# Patient Record
Sex: Male | Born: 2000 | Race: Black or African American | Hispanic: No | Marital: Single | Smoking: Never smoker
Health system: Southern US, Community
[De-identification: ages and names within clinical notes are randomized; demographics above are authoritative.]

---
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
## 2021-07-30 ENCOUNTER — Emergency Department
Admission: EM | Admit: 2021-07-30 | Discharge: 2021-07-31 | Disposition: A | Discharge status: Home / Self Care | Payer: Medicaid Other | Attending: Emergency Medicine | Admitting: Emergency Medicine

## 2021-07-30 ENCOUNTER — Other Ambulatory Visit: Payer: Self-pay

## 2021-07-30 DIAGNOSIS — R2242 Localized swelling, mass and lump, left lower limb: Secondary | ICD-10-CM | POA: Diagnosis present

## 2021-07-30 DIAGNOSIS — L0231 Cutaneous abscess of buttock: Secondary | ICD-10-CM | POA: Diagnosis not present

## 2021-07-30 DIAGNOSIS — Z5321 Procedure and treatment not carried out due to patient leaving prior to being seen by health care provider: Secondary | ICD-10-CM | POA: Diagnosis not present

## 2021-07-30 NOTE — ED Triage Notes (Addendum)
Pt in with co abscess to left buttocks x 5 days, has not drained. Pt with large abscess noted to right buttocks, not extending to rectum.

## 2023-02-25 ENCOUNTER — Encounter (HOSPITAL_COMMUNITY): Payer: Self-pay | Admitting: Emergency Medicine

## 2023-02-25 ENCOUNTER — Ambulatory Visit (HOSPITAL_COMMUNITY)
Admission: EM | Admit: 2023-02-25 | Discharge: 2023-02-25 | Disposition: A | Discharge status: Home / Self Care | Payer: 59 | Attending: Emergency Medicine | Admitting: Emergency Medicine

## 2023-02-25 ENCOUNTER — Other Ambulatory Visit: Payer: Self-pay

## 2023-02-25 DIAGNOSIS — J029 Acute pharyngitis, unspecified: Secondary | ICD-10-CM | POA: Diagnosis present

## 2023-02-25 DIAGNOSIS — H109 Unspecified conjunctivitis: Secondary | ICD-10-CM | POA: Insufficient documentation

## 2023-02-25 DIAGNOSIS — B9689 Other specified bacterial agents as the cause of diseases classified elsewhere: Secondary | ICD-10-CM | POA: Insufficient documentation

## 2023-02-25 LAB — POCT RAPID STREP A (OFFICE): Rapid Strep A Screen: NEGATIVE

## 2023-02-25 MED ORDER — POLYMYXIN B-TRIMETHOPRIM 10000-0.1 UNIT/ML-% OP SOLN: 1.0000 [drp] | OPHTHALMIC | 0 refills | Status: AC

## 2023-02-25 MED ORDER — LIDOCAINE VISCOUS HCL 2 % MT SOLN: 15.0000 mL | Freq: Four times a day (QID) | OROMUCOSAL | 0 refills | Status: AC | PRN

## 2023-02-25 NOTE — ED Triage Notes (Addendum)
Onset of symptoms was Friday, 5/3.  Left eye started turning red, then right eye.  Patient woke with sticky discharge around eye lashes.  Eyes itch.  Left eye has been itching.  Sore throat started 2 days ago.  Patient has a history of strep throat as a child.  Feels this throat pain is similar.    Eye "something" OTC eye drops.. helped with burn, but not redness.

## 2023-02-25 NOTE — ED Notes (Signed)
Reviewed work note 

## 2023-02-25 NOTE — Discharge Instructions (Signed)
We are treating you for bacterial eye infection.  Use Polytrim as every 4 hours while you are awake.  Make sure to wash your hands prior to handling medication and avoid touching tip of medication bottle to your eye to avoid possible contamination of the medicine.  You can use lubricating eyedrops for additional symptom relief.  As we discussed, this potentially is a virus which may have to run its course.  If your symptoms or not improving quickly please follow-up with ophthalmology; call to schedule an appointment.  If anything worsens and you have vision change, increasing eye discomfort, fever, nausea, vomiting you should be seen immediately.  You tested negative for strep.  Will send this for culture and contact you if need to start any antibiotics based on your culture results.  Gargle with warm salt water and alternate Tylenol with Profen for pain.  Use viscous lidocaine every 6 hours as needed.  Do not eat or drink immediately after using this medication as it increases the risk of choking.  If you have any worsening symptoms including swelling of your throat, shortness of breath, fever, nausea, vomiting you need to be seen immediately.

## 2023-02-25 NOTE — ED Provider Notes (Signed)
[facility]    CSN: [id number] Arrival date & time: 02/25/23  1913      History   Chief Complaint Chief Complaint  Patient presents with   Sore Throat   Eye Problem    HPI Caleb Frazier is a 22 y.o. male.   Patient presents today with 5-day history of eye redness and drainage.  Reports this began on his left eye but that spread to involve his right eye.  He has tried over-the-counter pinkeye drops without improvement of symptoms.  When he wakes up in the morning he is unable to open his eyes as result of the drainage.  He denies any ocular trauma, foreign body sensation, eye pain.  He is exposed to chemicals at work but reports that he consistently wears protective eyewear.  He denies any specific exposures and reports that his symptoms began after he took a nap and so not immediately after leaving work.  He does not wear glasses or contacts.  Denies any known sick contacts.  Denies any cough or congestion.  He has developed a sore throat with the pain being rated 7 on a 0-10 pain scale, described as aching, no aggravating or alleviating factors identified.  He denies any recent antibiotics or steroids.  He denies any muffled voice or swelling of his throat.  He is having difficulty with daily activities as result of symptoms.    History reviewed. No pertinent past medical history.  There are no problems to display for this patient.   History reviewed. No pertinent surgical history.     Home Medications    Prior to Admission medications   Medication Sig Start Date End Date Taking? Authorizing Provider  lidocaine (XYLOCAINE) 2 % solution Use as directed 15 mLs in the mouth or throat every 6 (six) hours as needed for mouth pain. 02/25/23  Yes Shronda Boeh K, PA-C  trimethoprim-polymyxin b (POLYTRIM) ophthalmic solution Place 1 drop into both eyes every 4 (four) hours. 02/25/23  Yes Aziya Arena, Noberto Retort, PA-C    Family History History reviewed. No pertinent family  history.  Social History Social History   Tobacco Use   Smoking status: Never   Smokeless tobacco: Never  Vaping Use   Vaping Use: Never used  Substance Use Topics   Alcohol use: Yes   Drug use: Yes    Types: Marijuana     Allergies   Amoxicillin   Review of Systems Review of Systems  Constitutional:  Positive for activity change. Negative for appetite change, fatigue and fever.  HENT:  Positive for sore throat. Negative for congestion, sinus pressure, sneezing, trouble swallowing and voice change.   Eyes:  Positive for discharge, redness and itching. Negative for photophobia, pain and visual disturbance.  Respiratory:  Negative for cough and shortness of breath.   Cardiovascular:  Negative for chest pain.  Gastrointestinal:  Negative for abdominal pain, diarrhea, nausea and vomiting.  Neurological:  Negative for dizziness, light-headedness and headaches.     Physical Exam Triage Vital Signs ED Triage Vitals  Enc Vitals Group     BP 02/25/23 2007 119/75     Pulse Rate 02/25/23 2007 69     Resp 02/25/23 2007 18     Temp 02/25/23 2007 98.2 F (36.8 C)     Temp src --      SpO2 02/25/23 2007 97 %     Weight --      Height --      Head Circumference --  Peak Flow --      Pain Score 02/25/23 2005 7     Pain Loc --      Pain Edu? --      Excl. in GC? --    No data found.  Updated Vital Signs BP 119/75 (BP Location: Left Arm)   Pulse 69   Temp 98.2 F (36.8 C)   Resp 18   SpO2 97%   Visual Acuity Right Eye Distance:   Left Eye Distance:   Bilateral Distance:    Right Eye Near:   Left Eye Near:    Bilateral Near:     Physical Exam Vitals reviewed.  Constitutional:      General: He is awake.     Appearance: Normal appearance. He is well-developed. He is not ill-appearing.     Comments: Very pleasant male appears stated age in no acute distress sitting comfortably in exam room  HENT:     Head: Normocephalic and atraumatic.     Right Ear:  Tympanic membrane, ear canal and external ear normal. Tympanic membrane is not erythematous or bulging.     Left Ear: Tympanic membrane, ear canal and external ear normal. Tympanic membrane is not erythematous or bulging.     Nose: Nose normal.     Mouth/Throat:     Pharynx: Uvula midline. Posterior oropharyngeal erythema present. No oropharyngeal exudate.  Eyes:     Extraocular Movements: Extraocular movements intact.     Conjunctiva/sclera:     Right eye: Right conjunctiva is injected. Exudate present. No chemosis.    Left eye: Left conjunctiva is injected. Exudate present. No chemosis.    Pupils: Pupils are equal, round, and reactive to light.  Cardiovascular:     Rate and Rhythm: Normal rate and regular rhythm.     Heart sounds: Normal heart sounds, S1 normal and S2 normal. No murmur heard. Pulmonary:     Effort: Pulmonary effort is normal. No accessory muscle usage or respiratory distress.     Breath sounds: Normal breath sounds. No stridor. No wheezing, rhonchi or rales.     Comments: Clear to auscultation bilaterally Neurological:     Mental Status: He is alert.  Psychiatric:        Behavior: Behavior is cooperative.      UC Treatments / Results  Labs (all labs ordered are listed, but only abnormal results are displayed) Labs Reviewed  CULTURE, GROUP A STREP [facility])  POCT RAPID STREP A (OFFICE)    EKG   Radiology No results found.  Procedures Procedures (including critical care time)  Medications Ordered in UC Medications - No data to display  Initial Impression / Assessment and Plan / UC Course  I have reviewed the triage vital signs and the nursing notes.  Pertinent labs & imaging results that were available during my care of the patient were reviewed by me and considered in my medical decision making (see chart for details).     Patient is well-appearing, afebrile, nontoxic, nontachycardic.  We discussed that given bilateral presentation of conjunctivitis  with associated URI symptoms it is possible that this is viral in nature.  Strep testing was obtained and was negative.  Will send this for culture but defer antibiotics until culture results are available.  He was given viscous lidocaine to help manage her symptoms with instruction not to eat or drink immediately after using this medication as it can increase the risk of choking.  He can use Tylenol/ibuprofen as well as gargling with warm  salt water to manage symptoms.  If he has any worsening symptoms he is to return for reevaluation including swelling of his throat, shortness of breath, dysphagia, muffled voice.  Given the significant amount of exudate and purulent drainage will cover for bacterial etiology with Polytrim but discussed that if this is a virus this would not provide resolution of symptoms.  He can use lubricating eyedrops for additional symptom relief.  If symptoms or not improving quickly he is to follow-up with ophthalmology was given contact information for local provider with instruction to call to schedule an appointment as needed.  Discussed that if his symptoms or not improving quickly or if anything worsens that he has eye pain, visual disturbance, nausea, vomiting, fever he needs to be seen immediately.  Strict return precautions given.  Work excuse note provided.  Final Clinical Impressions(s) / UC Diagnoses   Final diagnoses:  Bacterial conjunctivitis of both eyes  Sore throat     Discharge Instructions      We are treating you for bacterial eye infection.  Use Polytrim as every 4 hours while you are awake.  Make sure to wash your hands prior to handling medication and avoid touching tip of medication bottle to your eye to avoid possible contamination of the medicine.  You can use lubricating eyedrops for additional symptom relief.  As we discussed, this potentially is a virus which may have to run its course.  If your symptoms or not improving quickly please follow-up with  ophthalmology; call to schedule an appointment.  If anything worsens and you have vision change, increasing eye discomfort, fever, nausea, vomiting you should be seen immediately.  You tested negative for strep.  Will send this for culture and contact you if need to start any antibiotics based on your culture results.  Gargle with warm salt water and alternate Tylenol with Profen for pain.  Use viscous lidocaine every 6 hours as needed.  Do not eat or drink immediately after using this medication as it increases the risk of choking.  If you have any worsening symptoms including swelling of your throat, shortness of breath, fever, nausea, vomiting you need to be seen immediately.    ED Prescriptions     Medication Sig Dispense Auth. Provider   trimethoprim-polymyxin b (POLYTRIM) ophthalmic solution Place 1 drop into both eyes every 4 (four) hours. 10 mL Telia Amundson K, PA-C   lidocaine (XYLOCAINE) 2 % solution Use as directed 15 mLs in the mouth or throat every 6 (six) hours as needed for mouth pain. 100 mL Marenda Accardi K, PA-C      PDMP not reviewed this encounter.   Jeani Hawking, PA-C 02/25/23 2045

## 2023-02-26 LAB — CULTURE, GROUP A STREP (THRC)

## 2023-02-27 LAB — CULTURE, GROUP A STREP (THRC)

## 2023-02-28 LAB — CULTURE, GROUP A STREP (THRC)
